# Patient Record
Sex: Male | Born: 1935 | Race: White | Hispanic: No | Smoking: Former smoker
Health system: Southern US, Community
[De-identification: ages and names within clinical notes are randomized; demographics above are authoritative.]

## PROBLEM LIST (undated history)

## (undated) DIAGNOSIS — G9340 Encephalopathy, unspecified: Secondary | ICD-10-CM

## (undated) DIAGNOSIS — I10 Essential (primary) hypertension: Secondary | ICD-10-CM

## (undated) DIAGNOSIS — F0390 Unspecified dementia without behavioral disturbance: Secondary | ICD-10-CM

---

## 2002-01-13 ENCOUNTER — Encounter: Payer: Self-pay | Admitting: Orthopedic Surgery

## 2002-01-18 ENCOUNTER — Encounter: Payer: Self-pay | Admitting: Orthopedic Surgery

## 2002-01-18 ENCOUNTER — Observation Stay (HOSPITAL_COMMUNITY): Admission: RE | Admit: 2002-01-18 | Discharge: 2002-01-19 | Payer: Self-pay | Admitting: Orthopedic Surgery

## 2004-03-06 ENCOUNTER — Ambulatory Visit (HOSPITAL_BASED_OUTPATIENT_CLINIC_OR_DEPARTMENT_OTHER): Admission: RE | Admit: 2004-03-06 | Discharge: 2004-03-06 | Payer: Self-pay | Admitting: Orthopedic Surgery

## 2005-08-13 ENCOUNTER — Ambulatory Visit: Payer: Self-pay | Admitting: Physical Medicine & Rehabilitation

## 2005-08-13 ENCOUNTER — Inpatient Hospital Stay (HOSPITAL_COMMUNITY): Admission: RE | Admit: 2005-08-13 | Discharge: 2005-08-18 | Payer: Self-pay | Admitting: Orthopedic Surgery

## 2013-12-15 ENCOUNTER — Encounter (HOSPITAL_BASED_OUTPATIENT_CLINIC_OR_DEPARTMENT_OTHER): Payer: Self-pay | Admitting: Emergency Medicine

## 2013-12-15 ENCOUNTER — Emergency Department (HOSPITAL_BASED_OUTPATIENT_CLINIC_OR_DEPARTMENT_OTHER)
Admission: EM | Admit: 2013-12-15 | Discharge: 2013-12-16 | Disposition: A | Discharge status: Home / Self Care | Payer: Medicare Other | Attending: Emergency Medicine | Admitting: Emergency Medicine

## 2013-12-15 ENCOUNTER — Emergency Department (HOSPITAL_BASED_OUTPATIENT_CLINIC_OR_DEPARTMENT_OTHER): Payer: Medicare Other

## 2013-12-15 DIAGNOSIS — Z87891 Personal history of nicotine dependence: Secondary | ICD-10-CM | POA: Insufficient documentation

## 2013-12-15 DIAGNOSIS — F17200 Nicotine dependence, unspecified, uncomplicated: Secondary | ICD-10-CM | POA: Insufficient documentation

## 2013-12-15 DIAGNOSIS — Z79899 Other long term (current) drug therapy: Secondary | ICD-10-CM | POA: Insufficient documentation

## 2013-12-15 DIAGNOSIS — S59919A Unspecified injury of unspecified forearm, initial encounter: Secondary | ICD-10-CM

## 2013-12-15 DIAGNOSIS — Z8659 Personal history of other mental and behavioral disorders: Secondary | ICD-10-CM | POA: Diagnosis not present

## 2013-12-15 DIAGNOSIS — W19XXXA Unspecified fall, initial encounter: Secondary | ICD-10-CM

## 2013-12-15 DIAGNOSIS — Z8669 Personal history of other diseases of the nervous system and sense organs: Secondary | ICD-10-CM | POA: Diagnosis not present

## 2013-12-15 DIAGNOSIS — Y9389 Activity, other specified: Secondary | ICD-10-CM | POA: Insufficient documentation

## 2013-12-15 DIAGNOSIS — I10 Essential (primary) hypertension: Secondary | ICD-10-CM | POA: Insufficient documentation

## 2013-12-15 DIAGNOSIS — S59909A Unspecified injury of unspecified elbow, initial encounter: Secondary | ICD-10-CM | POA: Insufficient documentation

## 2013-12-15 DIAGNOSIS — Y9210 Unspecified residential institution as the place of occurrence of the external cause: Secondary | ICD-10-CM | POA: Diagnosis not present

## 2013-12-15 DIAGNOSIS — R42 Dizziness and giddiness: Secondary | ICD-10-CM | POA: Insufficient documentation

## 2013-12-15 DIAGNOSIS — S51809A Unspecified open wound of unspecified forearm, initial encounter: Secondary | ICD-10-CM | POA: Insufficient documentation

## 2013-12-15 DIAGNOSIS — IMO0002 Reserved for concepts with insufficient information to code with codable children: Secondary | ICD-10-CM | POA: Diagnosis not present

## 2013-12-15 DIAGNOSIS — R296 Repeated falls: Secondary | ICD-10-CM | POA: Diagnosis not present

## 2013-12-15 DIAGNOSIS — S51811A Laceration without foreign body of right forearm, initial encounter: Secondary | ICD-10-CM

## 2013-12-15 DIAGNOSIS — S6990XA Unspecified injury of unspecified wrist, hand and finger(s), initial encounter: Secondary | ICD-10-CM

## 2013-12-15 HISTORY — DX: Encephalopathy, unspecified: G93.40

## 2013-12-15 HISTORY — DX: Essential (primary) hypertension: I10

## 2013-12-15 HISTORY — DX: Unspecified dementia, unspecified severity, without behavioral disturbance, psychotic disturbance, mood disturbance, and anxiety: F03.90

## 2013-12-15 NOTE — ED Notes (Signed)
Pt fell this pm  ? Dizziness before fall  Not witnessed,  Ambulatory on scene  Has tear to rt arm  hematome to rt side,  Generalized soreness

## 2013-12-15 NOTE — ED Provider Notes (Addendum)
CSN: [id number]     Arrival date & time 12/15/13  2258 History  This chart was scribed for Samuel SeamenJohn L Matai Carpenito, MD by Carl Bestelina Holson, ED Scribe. This patient was seen in room MH01/MH01 and the patient's care was started at 11:30 PM.      Chief Complaint  Patient presents with  . Fall   Patient is a 80 y.o. male presenting with fall. The history is provided by a relative and the patient. The history is limited by the condition of the patient. No language interpreter was used.  Fall  HPI Comments: Samuel Young is a 80 y.o. male who presents to the Emergency Department complaining of lower back pain and a wound to his right arm that he incurred after he fell this evening while getting something to eat. He states that he remembers the fall and being dizzy for a few seconds prior to the incident. Pain is mild to moderate, worse with palpation or movement. He states he had similar dizzy spells frequently but is usually able to sit down or hold onto something and time. [facility]  Past Medical History  Diagnosis Date  . Hypertension   . Encephalopathy acute   . Dementia    No past surgical history on file. No family history on file. History  Substance Use Topics  . Smoking status: Former Games developermoker  . Smokeless tobacco: Not on file  . Alcohol Use: No    Review of Systems  Skin: Positive for wound.  Neurological: Positive for dizziness.  All other systems reviewed and are negative.   Allergies  Review of patient's allergies indicates no known allergies.  Home Medications   Prior to Admission medications   Medication Sig Start Date End Date Taking? Authorizing Provider  acetaminophen (TYLENOL) 650 MG CR tablet Take 650 mg by mouth every 8 (eight) hours as needed for pain.   Yes Historical Provider, MD  alum & mag hydroxide-simeth (MAALOX PLUS) 400-400-40 MG/5ML suspension Take by mouth every 6 (six) hours as needed for indigestion.   Yes  Historical Provider, MD  diphenhydrAMINE (BENADRYL) 25 mg capsule Take 25 mg by mouth every 6 (six) hours as needed.   Yes Historical Provider, MD  DULoxetine (CYMBALTA) 60 MG capsule Take 60 mg by mouth daily.   Yes Historical Provider, MD  lacosamide (VIMPAT) 50 MG TABS tablet Take 100 mg by mouth 2 (two) times daily.   Yes Historical Provider, MD  lisinopril-hydrochlorothiazide (PRINZIDE,ZESTORETIC) 10-12.5 MG per tablet Take 1 tablet by mouth daily.   Yes Historical Provider, MD  LORazepam (ATIVAN) 1 MG tablet Take 1 mg by mouth every 8 (eight) hours.   Yes Historical Provider, MD  nicotine (NICODERM CQ - DOSED IN MG/24 HR) 7 mg/24hr patch Place 7 mg onto the skin daily.   Yes Historical Provider, MD  ondansetron (ZOFRAN) 4 MG tablet Take 4 mg by mouth every 8 (eight) hours as needed for nausea or vomiting.   Yes Historical Provider, MD  sennosides-docusate sodium (SENOKOT-S) 8.6-50 MG tablet Take 1 tablet by mouth daily.   Yes Historical Provider, MD  traZODone (DESYREL) 50 MG tablet Take 50 mg by mouth at bedtime.   Yes Historical Provider, MD   Triage Vitals: BP 125/60  Pulse 86  Temp(Src) 98 F (36.7 C) (Oral)  Resp 21  Ht 6\' 3"  (1.905 m)  Wt 230 lb (104.327 kg)  BMI 28.75 kg/m2  SpO2 99%  Physical Exam  Nursing note and  vitals reviewed. Constitutional: He appears well-developed and well-nourished.  HENT:  Head: Normocephalic and atraumatic.  Lens implants.  No scalp hematomas.   Eyes: EOM are normal. Pupils are equal, round, and reactive to light.  Neck: Normal range of motion.  Mild posterior tenderness, primarily soft tissue.  Cardiovascular: Normal rate and regular rhythm.   Pulmonary/Chest: Breath sounds normal. No respiratory distress. He has no wheezes. He has no rales. He exhibits no tenderness.  Abdominal: Soft. There is no tenderness.  Musculoskeletal:       Thoracic back: He exhibits no tenderness.       Lumbar back: He exhibits tenderness.  Lumbar tenderness  but no thoracic tenderness.  Chronic appearing edema of the lower legs.  Skin: Skin is warm and dry.  Skin tear to his right forearm.    Neurologic: Awake alert; motor function intact in all extremities and symmetric; no facial droop; normal coordination and speech  ED Course  Procedures (including critical care time)  DIAGNOSTIC STUDIES: Oxygen Saturation is 99% on room air, normal by my interpretation.     MDM    EKG Interpretation  Date/Time:  Saturday December 16 2013 00:18:11 EDT Ventricular Rate:  74 PR Interval:  178 QRS Duration: 82 QT Interval:  372 QTC Calculation: 412 R Axis:   -17 Text Interpretation:  Normal sinus rhythm Normal ECG No significant change was found Confirmed by Nilaya Bouie  MD, Jonny Ruiz ([id number]) on 12/16/2013 12:25:57 AM       Nursing notes and vitals signs, including pulse oximetry, reviewed.  Summary of this visit's results, reviewed by myself:  Imaging Studies: Dg Cervical Spine Complete  12/16/2013   CLINICAL DATA:  Fall, right neck pain.  EXAM: CERVICAL SPINE  4+ VIEWS  COMPARISON:  None.  FINDINGS: Patient is osteopenic. No destructive bony lesions straightened cervical lordosis. Vertebral bodies appear intact and aligned. Severe C5-6 disc degeneration. Multilevel severe uncovertebral hypertrophy and facet arthropathy. At least moderate right C3-4, C4-5 and left C3-4 neural foraminal narrowing. Lateral masses in alignment. Prevertebral and paraspinal soft tissue planes are nonsuspicious.  IMPRESSION: Straightened cervical lordosis without acute fracture deformity nor malalignment though, considering patient's osteopenia, if pain persists consider follow-up CT cervical spine.   Electronically Signed   By: Awilda Metro   On: 12/16/2013 00:18   Dg Lumbar Spine Complete  12/16/2013   CLINICAL DATA:  Fall, back pain.  EXAM: LUMBAR SPINE - COMPLETE 4+ VIEW  COMPARISON:  None.  FINDINGS: Lumbar vertebral bodies appear intact and aligned and maintenance of the  lumbar lordosis. Severe L1-2, L3-4 through L5-S1 disc degeneration with endplate spurring. No destructive bony lesions no pars interarticularis defects. At least moderate lower lumbar facet arthropathy. No destructive bony lesions, bone mineral density is decreased.  Severe calcific atherosclerosis of the aorta. Sacroiliac joints are symmetric. Surgical clips in the pelvis.  IMPRESSION: No acute fracture deformity or malalignment.  Lumbar spondylosis.   Electronically Signed   By: Awilda Metro   On: 12/16/2013 00:19    I personally performed the services described in this documentation, which was scribed in my presence. The recorded information has been reviewed and is accurate.   Samuel Seamen, MD 12/16/13 6045  Samuel Seamen, MD 12/16/13 4098

## 2013-12-15 NOTE — ED Notes (Signed)
un witnessed fall this pm  Pt states was dizzy and fell  Skin tear to rt arm and hamartoma to rt side  Increased soreness

## 2013-12-16 NOTE — Discharge Instructions (Signed)

## 2014-08-29 ENCOUNTER — Emergency Department (HOSPITAL_BASED_OUTPATIENT_CLINIC_OR_DEPARTMENT_OTHER): Payer: 59

## 2014-08-29 ENCOUNTER — Emergency Department (HOSPITAL_BASED_OUTPATIENT_CLINIC_OR_DEPARTMENT_OTHER)
Admission: EM | Admit: 2014-08-29 | Discharge: 2014-08-29 | Disposition: A | Discharge status: Home / Self Care | Payer: 59 | Attending: Emergency Medicine | Admitting: Emergency Medicine

## 2014-08-29 ENCOUNTER — Encounter (HOSPITAL_BASED_OUTPATIENT_CLINIC_OR_DEPARTMENT_OTHER): Payer: Self-pay | Admitting: *Deleted

## 2014-08-29 DIAGNOSIS — S00432A Contusion of left ear, initial encounter: Secondary | ICD-10-CM | POA: Insufficient documentation

## 2014-08-29 DIAGNOSIS — S20211A Contusion of right front wall of thorax, initial encounter: Secondary | ICD-10-CM | POA: Diagnosis not present

## 2014-08-29 DIAGNOSIS — I10 Essential (primary) hypertension: Secondary | ICD-10-CM | POA: Insufficient documentation

## 2014-08-29 DIAGNOSIS — W01198A Fall on same level from slipping, tripping and stumbling with subsequent striking against other object, initial encounter: Secondary | ICD-10-CM | POA: Diagnosis not present

## 2014-08-29 DIAGNOSIS — Y9289 Other specified places as the place of occurrence of the external cause: Secondary | ICD-10-CM | POA: Diagnosis not present

## 2014-08-29 DIAGNOSIS — Z79899 Other long term (current) drug therapy: Secondary | ICD-10-CM | POA: Insufficient documentation

## 2014-08-29 DIAGNOSIS — Y9389 Activity, other specified: Secondary | ICD-10-CM | POA: Diagnosis not present

## 2014-08-29 DIAGNOSIS — S0990XA Unspecified injury of head, initial encounter: Secondary | ICD-10-CM | POA: Diagnosis present

## 2014-08-29 DIAGNOSIS — Z8669 Personal history of other diseases of the nervous system and sense organs: Secondary | ICD-10-CM | POA: Diagnosis not present

## 2014-08-29 DIAGNOSIS — Y998 Other external cause status: Secondary | ICD-10-CM | POA: Insufficient documentation

## 2014-08-29 DIAGNOSIS — F0390 Unspecified dementia without behavioral disturbance: Secondary | ICD-10-CM | POA: Diagnosis not present

## 2014-08-29 NOTE — ED Notes (Signed)
Patient transported to X-ray and CT and returned

## 2014-08-29 NOTE — ED Notes (Signed)
Pt c/o fall from standing landing on tile hitting head  Floor pt from [facility]

## 2014-08-29 NOTE — Discharge Instructions (Signed)
Head Injury °You have received a head injury. It does not appear serious at this time. Headaches and vomiting are common following head injury. It should be easy to awaken from sleeping. Sometimes it is necessary for you to stay in the emergency department for a while for observation. Sometimes admission to the hospital may be needed. After injuries such as yours, most problems occur within the first 24 hours, but side effects may occur up to 7-10 days after the injury. It is important for you to carefully monitor your condition and contact your health care provider or seek immediate medical care if there is a change in your condition. °WHAT ARE THE TYPES OF HEAD INJURIES? °Head injuries can be as minor as a bump. Some head injuries can be more severe. More severe head injuries include: °· A jarring injury to the brain (concussion). °· A bruise of the brain (contusion). This mean there is bleeding in the brain that can cause swelling. °· A cracked skull (skull fracture). °· Bleeding in the brain that collects, clots, and forms a bump (hematoma). °WHAT CAUSES A HEAD INJURY? °A serious head injury is most likely to happen to someone who is in a car wreck and is not wearing a seat belt. Other causes of major head injuries include bicycle or motorcycle accidents, sports injuries, and falls. °HOW ARE HEAD INJURIES DIAGNOSED? °A complete history of the event leading to the injury and your current symptoms will be helpful in diagnosing head injuries. Many times, pictures of the brain, such as CT or MRI are needed to see the extent of the injury. Often, an overnight hospital stay is necessary for observation.  °WHEN SHOULD I SEEK IMMEDIATE MEDICAL CARE?  °You should get help right away if: °· You have confusion or drowsiness. °· You feel sick to your stomach (nauseous) or have continued, forceful vomiting. °· You have dizziness or unsteadiness that is getting worse. °· You have severe, continued headaches not relieved by  medicine. Only take over-the-counter or prescription medicines for pain, fever, or discomfort as directed by your health care provider. °· You do not have normal function of the arms or legs or are unable to walk. °· You notice changes in the black spots in the center of the colored part of your eye (pupil). °· You have a clear or bloody fluid coming from your nose or ears. °· You have a loss of vision. °During the next 24 hours after the injury, you must stay with someone who can watch you for the warning signs. This person should contact local emergency services (911 in the [place] if you have seizures, you become unconscious, or you are unable to wake up. °HOW CAN I PREVENT A HEAD INJURY IN THE FUTURE? °The most important factor for preventing major head injuries is avoiding motor vehicle accidents.  To minimize the potential for damage to your head, it is crucial to wear seat belts while riding in motor vehicles. Wearing helmets while bike riding and playing collision sports (like football) is also helpful. Also, avoiding dangerous activities around the house will further help reduce your risk of head injury.  °WHEN CAN I RETURN TO NORMAL ACTIVITIES AND ATHLETICS? °You should be reevaluated by your health care provider before returning to these activities. If you have any of the following symptoms, you should not return to activities or contact sports until 1 week after the symptoms have stopped: °· Persistent headache. °· Dizziness or vertigo. °· Poor attention and concentration. °· Confusion. °·   Memory problems.  Nausea or vomiting.  Fatigue or tire easily.  Irritability.  Intolerant of bright lights or loud noises.  Anxiety or depression.  Disturbed sleep. MAKE SURE YOU:   Understand these instructions.  Will watch your condition.  Will get help right away if you are not doing well or get worse. Document Released: 05/04/2005 Document Revised: 05/09/2013 Document Reviewed:  01/09/2013 [facility] Patient Information 2015 [employer] [place]. This information is not intended to replace advice given to you by your health care provider. Make sure you discuss any questions you have with your health care provider.   Chest Contusion A contusion is a deep bruise. Bruises happen when an injury causes bleeding under the skin. Signs of bruising include pain, puffiness (swelling), and discolored skin. The bruise may turn blue, purple, or yellow.  HOME CARE  Put ice on the injured area.  Put ice in a plastic bag.  Place a towel between the skin and the bag.  Leave the ice on for 15-20 minutes at a time, 03-04 times a day for the first 48 hours.  Only take medicine as told by your doctor.  Rest.  Take deep breaths (deep-breathing exercises) as told by your doctor.  Stop smoking if you smoke.  Do not lift objects over 5 pounds (2.3 kilograms) for 3 days or longer if told by your doctor. GET HELP RIGHT AWAY IF:   You have more bruising or puffiness.  You have pain that gets worse.  You have trouble breathing.  You are dizzy, weak, or pass out (faint).  You have blood in your pee (urine) or poop (stool).  You cough up or throw up (vomit) blood.  Your puffiness or pain is not helped with medicines. MAKE SURE YOU:   Understand these instructions.  Will watch your condition.  Will get help right away if you are not doing well or get worse. Document Released: 10/21/2007 Document Revised: 01/27/2012 Document Reviewed: 10/26/2011 [facility] Patient Information 2015 [employer], [place]. This information is not intended to replace advice given to you by your health care provider. Make sure you discuss any questions you have with your health care provider.

## 2014-08-29 NOTE — ED Provider Notes (Signed)
CSN: [id number]     Arrival date & time 08/29/14  1655 History  This chart was scribed for Toy Cookey, MD by Chestine Spore, ED Scribe. The patient was seen in room MHH2/MHH2 at 7:02 PM.     Chief Complaint  Patient presents with  . Fall      The history is provided by the patient. No language interpreter was used.     HPI Comments: Samuel Young is a 81 y.o. male with a medical hx of HTN, encephalopathy acute, and dementia, who presents to the Emergency Department complaining of fall onset last night at 12:30. Pt lives at [facility] and the pt fell from standing landing. Pt tripped while he was going to the bathroom. Pt now has a knot on the side of his head when he landed on the chair and the wall. Pt was dx with orthostatics HTN. Pt was dizzy before the fall. Pt can not remember the fall. Pt notes that no one else saw the fall. The pt got back into his bed after the incident and didn't tell the people at his living home.   He states that he is having associated symptoms of hitting his head, neck pain, left rib pain. Pt has neck pain when he turns his head to the left. Pt son reports that the pt seems groggy and lethargic. He denies LOC, HA, hip pain, n/v, and any other symptoms. Pt is not taking blood thinners but the pt is on a baby ASA. Pt uses a walker to aid with ambulating. Pt has not ate today.   PCP- Dr. Redmond School   Past Medical History  Diagnosis Date  . Hypertension   . Encephalopathy acute   . Dementia    History reviewed. No pertinent past surgical history. History reviewed. No pertinent family history. History  Substance Use Topics  . Smoking status: Former Games developer  . Smokeless tobacco: Not on file  . Alcohol Use: No    Review of Systems  Musculoskeletal: Positive for arthralgias (left rib pain) and neck pain. Negative for neck stiffness.  Neurological: Positive for dizziness. Negative for syncope.    A complete 10 system review of systems was  obtained and all systems are negative except as noted in the HPI and PMH.    Allergies  Review of patient's allergies indicates no known allergies.  Home Medications   Prior to Admission medications   Medication Sig Start Date End Date Taking? Authorizing Provider  lacosamide (VIMPAT) 200 MG TABS tablet Take 100 mg by mouth 2 (two) times daily.   Yes Historical Provider, MD  levETIRAcetam (KEPPRA) 500 MG tablet Take 500 mg by mouth 2 (two) times daily.   Yes Historical Provider, MD  memantine (NAMENDA) 5 MG tablet Take 14 mg by mouth 2 (two) times daily.   Yes Historical Provider, MD  acetaminophen (TYLENOL) 650 MG CR tablet Take 650 mg by mouth every 8 (eight) hours as needed for pain.    Historical Provider, MD  alum & mag hydroxide-simeth (MAALOX PLUS) 400-400-40 MG/5ML suspension Take by mouth every 6 (six) hours as needed for indigestion.    Historical Provider, MD  diphenhydrAMINE (BENADRYL) 25 mg capsule Take 25 mg by mouth every 6 (six) hours as needed.    Historical Provider, MD  DULoxetine (CYMBALTA) 60 MG capsule Take 60 mg by mouth daily.    Historical Provider, MD  lisinopril-hydrochlorothiazide (PRINZIDE,ZESTORETIC) 10-12.5 MG per tablet Take 1 tablet by mouth daily.    Historical  Provider, MD  LORazepam (ATIVAN) 1 MG tablet Take 1 mg by mouth every 8 (eight) hours.    Historical Provider, MD  ondansetron (ZOFRAN) 4 MG tablet Take 4 mg by mouth every 8 (eight) hours as needed for nausea or vomiting.    Historical Provider, MD  sennosides-docusate sodium (SENOKOT-S) 8.6-50 MG tablet Take 1 tablet by mouth daily.    Historical Provider, MD  traZODone (DESYREL) 50 MG tablet Take 50 mg by mouth at bedtime.    Historical Provider, MD   BP 133/83 mmHg  Pulse 86  Temp(Src) 97.7 F (36.5 C)  Resp 16  Ht 6\' 2"  (1.88 m)  Wt 223 lb (101.152 kg)  BMI 28.62 kg/m2  SpO2 100%  Physical Exam  Constitutional: He is oriented to person, place, and time. He appears well-developed and  well-nourished. No distress.  HENT:  Head: Normocephalic. Head is with contusion.  Mouth/Throat: No oropharyngeal exudate.  contusion over left ear.   Eyes: Pupils are equal, round, and reactive to light.  Neck: Normal range of motion. Neck supple.  Left SCM tenderness.   Cardiovascular: Normal rate, regular rhythm and normal heart sounds.  Exam reveals no gallop and no friction rub.   No murmur heard. Pulmonary/Chest: Effort normal and breath sounds normal. No respiratory distress. He has no wheezes. He has no rales. He exhibits tenderness.  Right anterior chest wall tenderness.  Abdominal: Soft. Bowel sounds are normal. He exhibits no distension and no mass. There is no tenderness. There is no rebound and no guarding.  Musculoskeletal: Normal range of motion. He exhibits no edema or tenderness.  Neurological: He is alert and oriented to person, place, and time. He displays no atrophy and no tremor. No cranial nerve deficit or sensory deficit. He exhibits normal muscle tone. He displays no seizure activity. Coordination and gait normal. GCS eye subscore is 4. GCS verbal subscore is 4. GCS motor subscore is 5.  Skin: Skin is warm and dry.  Psychiatric: He has a normal mood and affect.  Nursing note and vitals reviewed.   ED Course  Procedures (including critical care time) DIAGNOSTIC STUDIES: Oxygen Saturation is 100% on RA, normal by my interpretation.    COORDINATION OF CARE: 7:10 PM-Discussed treatment plan which includes CT head without constrast, CT C-Spine without contrast, CXR, and EKG, with pt at bedside and pt agreed to plan.   Labs Review Labs Reviewed - No data to display  Imaging Review Dg Chest 2 View  08/29/2014   CLINICAL DATA:  Chest pain after fall  EXAM: CHEST  2 VIEW  COMPARISON:  August 11, 2005  FINDINGS: There is bibasilar fibrotic type change. There is no edema or consolidation. Heart size and pulmonary vascularity are within normal limits. No adenopathy. There  is atherosclerotic change in the aorta. There is mild anterior wedging of a mid thoracic vertebral body age uncertain. No pneumothorax.  IMPRESSION: Bibasilar interstitial fibrosis. No edema or consolidation. Atherosclerotic calcification.   Electronically Signed   By: Bretta BangWilliam  Woodruff III M.D.   On: 08/29/2014 20:49   Ct Head Wo Contrast  08/29/2014   CLINICAL DATA:  Fall from standing landing on tile floor, hitting head. Denies loss of consciousness.  EXAM: CT HEAD WITHOUT CONTRAST  CT CERVICAL SPINE WITHOUT CONTRAST  TECHNIQUE: Multidetector CT imaging of the head and cervical spine was performed following the standard protocol without intravenous contrast. Multiplanar CT image reconstructions of the cervical spine were also generated.  COMPARISON:  None.  FINDINGS: CT  HEAD FINDINGS  No intracranial hemorrhage, mass effect, or midline shift. No hydrocephalus. The basilar cisterns are patent. No evidence of territorial infarct. No intracranial fluid collection. Generalized cerebral and cerebellar atrophy. Moderate periventricular and deep white matter change. Minimal left subgaleal hematoma. Calvarium is intact. Included paranasal sinuses and mastoid air cells are well aerated.  CT CERVICAL SPINE FINDINGS  No acute fracture. No subluxation. No jumped or perched facets. The dens is intact. There is multilevel degenerative disc disease and facet arthropathy throughout. Disc space narrowing from C3-C4 through C7-T1. Grade 1 anterolisthesis of C6 on C7 appears degenerative. Diffuse facet arthropathy throughout. There is no prevertebral soft tissue edema.  IMPRESSION: 1.  No acute intracranial abnormality. 2. Diffuse degenerative change throughout cervical spine without acute fracture or subluxation.   Electronically Signed   By: Rubye Oaks M.D.   On: 08/29/2014 20:27   Ct Cervical Spine Wo Contrast  08/29/2014   CLINICAL DATA:  Fall from standing landing on tile floor, hitting head. Denies loss of  consciousness.  EXAM: CT HEAD WITHOUT CONTRAST  CT CERVICAL SPINE WITHOUT CONTRAST  TECHNIQUE: Multidetector CT imaging of the head and cervical spine was performed following the standard protocol without intravenous contrast. Multiplanar CT image reconstructions of the cervical spine were also generated.  COMPARISON:  None.  FINDINGS: CT HEAD FINDINGS  No intracranial hemorrhage, mass effect, or midline shift. No hydrocephalus. The basilar cisterns are patent. No evidence of territorial infarct. No intracranial fluid collection. Generalized cerebral and cerebellar atrophy. Moderate periventricular and deep white matter change. Minimal left subgaleal hematoma. Calvarium is intact. Included paranasal sinuses and mastoid air cells are well aerated.  CT CERVICAL SPINE FINDINGS  No acute fracture. No subluxation. No jumped or perched facets. The dens is intact. There is multilevel degenerative disc disease and facet arthropathy throughout. Disc space narrowing from C3-C4 through C7-T1. Grade 1 anterolisthesis of C6 on C7 appears degenerative. Diffuse facet arthropathy throughout. There is no prevertebral soft tissue edema.  IMPRESSION: 1.  No acute intracranial abnormality. 2. Diffuse degenerative change throughout cervical spine without acute fracture or subluxation.   Electronically Signed   By: Rubye Oaks M.D.   On: 08/29/2014 20:27     EKG Interpretation   Date/Time:  Wednesday August 29 2014 19:29:47 EDT Ventricular Rate:  71 PR Interval:  174 QRS Duration: 88 QT Interval:  380 QTC Calculation: 412 R Axis:   -16 Text Interpretation:  Normal sinus rhythm Nonspecific T wave abnormality  Abnormal ECG No significant change since last tracing Confirmed by  DOCHERTY  MD, MEGAN [phone]) on 08/29/2014 7:34:49 PM      MDM   Final diagnoses:  Closed head injury, initial encounter  Chest wall contusion, right, initial encounter    Pt is a 81 y.o. male with Pmhx as above who presents with fall at  nursing home some time yesterday. Pt reported to staff today that he had fallen this morning and was sent of eval. Pt has no complaints currently. He has localiezed ttp of L parietal skull and R anterior chest wall. Neuro exam unremarkable, mental status at baseline. CT head, c-spine, CXR unremarkable. No acute EKG changes. Will d/c home.      Stephannie Peters evaluation in the Emergency Department is complete. It has been determined that no acute conditions requiring further emergency intervention are present at this time. The patient/guardian have been advised of the diagnosis and plan. We have discussed signs and symptoms that warrant return to the ED,  such as changes or worsening in symptoms, worsening pain, confusion, numbness, weakness, N/V.     I personally performed the services described in this documentation, which was scribed in my presence. The recorded information has been reviewed and is accurate.     Toy Cookey, MD 09/01/14 [phone]

## 2014-08-29 NOTE — ED Notes (Signed)
Patient transported to CT 

## 2014-08-30 ENCOUNTER — Emergency Department (HOSPITAL_COMMUNITY): Payer: 59

## 2014-08-30 ENCOUNTER — Emergency Department (HOSPITAL_COMMUNITY)
Admission: EM | Admit: 2014-08-30 | Discharge: 2014-08-30 | Disposition: A | Discharge status: Home / Self Care | Payer: 59 | Attending: Emergency Medicine | Admitting: Emergency Medicine

## 2014-08-30 ENCOUNTER — Encounter (HOSPITAL_COMMUNITY): Payer: Self-pay | Admitting: Emergency Medicine

## 2014-08-30 DIAGNOSIS — F0390 Unspecified dementia without behavioral disturbance: Secondary | ICD-10-CM | POA: Diagnosis not present

## 2014-08-30 DIAGNOSIS — Y9389 Activity, other specified: Secondary | ICD-10-CM | POA: Diagnosis not present

## 2014-08-30 DIAGNOSIS — I10 Essential (primary) hypertension: Secondary | ICD-10-CM | POA: Insufficient documentation

## 2014-08-30 DIAGNOSIS — Z8669 Personal history of other diseases of the nervous system and sense organs: Secondary | ICD-10-CM | POA: Diagnosis not present

## 2014-08-30 DIAGNOSIS — Z79899 Other long term (current) drug therapy: Secondary | ICD-10-CM | POA: Insufficient documentation

## 2014-08-30 DIAGNOSIS — W01198A Fall on same level from slipping, tripping and stumbling with subsequent striking against other object, initial encounter: Secondary | ICD-10-CM | POA: Diagnosis not present

## 2014-08-30 DIAGNOSIS — Y998 Other external cause status: Secondary | ICD-10-CM | POA: Diagnosis not present

## 2014-08-30 DIAGNOSIS — Y92128 Other place in nursing home as the place of occurrence of the external cause: Secondary | ICD-10-CM | POA: Insufficient documentation

## 2014-08-30 DIAGNOSIS — S0083XA Contusion of other part of head, initial encounter: Secondary | ICD-10-CM | POA: Diagnosis not present

## 2014-08-30 DIAGNOSIS — Z87891 Personal history of nicotine dependence: Secondary | ICD-10-CM | POA: Insufficient documentation

## 2014-08-30 DIAGNOSIS — S0990XA Unspecified injury of head, initial encounter: Secondary | ICD-10-CM | POA: Diagnosis present

## 2014-08-30 DIAGNOSIS — S0093XA Contusion of unspecified part of head, initial encounter: Secondary | ICD-10-CM

## 2014-08-30 DIAGNOSIS — W19XXXA Unspecified fall, initial encounter: Secondary | ICD-10-CM

## 2014-08-30 LAB — CBC WITH DIFFERENTIAL/PLATELET
BASOS PCT: 0 % (ref 0–1)
Basophils Absolute: 0 10*3/uL (ref 0.0–0.1)
Eosinophils Absolute: 0.3 10*3/uL (ref 0.0–0.7)
Eosinophils Relative: 3 % (ref 0–5)
HCT: 48.6 % (ref 39.0–52.0)
Hemoglobin: 15.7 g/dL (ref 13.0–17.0)
Lymphocytes Relative: 23 % (ref 12–46)
Lymphs Abs: 2 10*3/uL (ref 0.7–4.0)
MCH: 28.8 pg (ref 26.0–34.0)
MCHC: 32.3 g/dL (ref 30.0–36.0)
MCV: 89 fL (ref 78.0–100.0)
MONOS PCT: 8 % (ref 3–12)
Monocytes Absolute: 0.7 10*3/uL (ref 0.1–1.0)
NEUTROS PCT: 66 % (ref 43–77)
Neutro Abs: 6 10*3/uL (ref 1.7–7.7)
PLATELETS: 243 10*3/uL (ref 150–400)
RBC: 5.46 MIL/uL (ref 4.22–5.81)
RDW: 13 % (ref 11.5–15.5)
WBC: 9 10*3/uL (ref 4.0–10.5)

## 2014-08-30 LAB — URINALYSIS, ROUTINE W REFLEX MICROSCOPIC
GLUCOSE, UA: NEGATIVE mg/dL
Hgb urine dipstick: NEGATIVE
KETONES UR: NEGATIVE mg/dL
NITRITE: NEGATIVE
PROTEIN: 30 mg/dL — AB
Specific Gravity, Urine: 1.029 (ref 1.005–1.030)
Urobilinogen, UA: 1 mg/dL (ref 0.0–1.0)
pH: 6 (ref 5.0–8.0)

## 2014-08-30 LAB — COMPREHENSIVE METABOLIC PANEL
ALT: 13 U/L (ref 0–53)
AST: 23 U/L (ref 0–37)
Albumin: 4.2 g/dL (ref 3.5–5.2)
Alkaline Phosphatase: 58 U/L (ref 39–117)
Anion gap: 8 (ref 5–15)
BUN: 24 mg/dL — AB (ref 6–23)
CO2: 30 mmol/L (ref 19–32)
CREATININE: 1.32 mg/dL (ref 0.50–1.35)
Calcium: 9.8 mg/dL (ref 8.4–10.5)
Chloride: 97 mmol/L (ref 96–112)
GFR calc non Af Amer: 48 mL/min — ABNORMAL LOW (ref >90)
GFR, EST AFRICAN AMERICAN: 56 mL/min — AB (ref >90)
GLUCOSE: 99 mg/dL (ref 70–99)
Potassium: 3.7 mmol/L (ref 3.5–5.1)
Sodium: 135 mmol/L (ref 135–145)
Total Bilirubin: 1 mg/dL (ref 0.3–1.2)
Total Protein: 7.4 g/dL (ref 6.0–8.3)

## 2014-08-30 LAB — URINE MICROSCOPIC-ADD ON

## 2014-08-30 NOTE — ED Notes (Signed)
[facility] for further evaluation after his fall today.  Pt reported that he was squatting to get something out of his dresser when he lost his balance and fell, hitting his head into the dresser and sustained hematoma to scalp on top of head.  Pt presents to ED A/Ox4, denies headache or dizziness, no s/s distress noted.

## 2014-08-30 NOTE — ED Provider Notes (Signed)
CSN: [id number]     Arrival date & time 08/30/14  1837 History   First MD Initiated Contact with Patient 08/30/14 1951     Chief Complaint  Patient presents with  . Fall  . Head Injury    Patient is a 81 y.o. male presenting with fall and head injury. The history is provided by the patient and a relative. No language interpreter was used.  Fall  Head Injury  [name] presents for evaluation of injuries from the fall. He was squatting at home and he went to get up and fell backwards striking the top of his head. This happened prior to ED arrival. He denies any loss of consciousness or headache. He had a fall yesterday and was evaluated for injuries at that time. Family is concerned because he's had 2 falls in the last 2 days. He denies any current complaints and wishes to go home. He takes Keppra and Vimpat for seizures. He does have a history of dementia. He is on a daily aspirin.   Past Medical History  Diagnosis Date  . Hypertension   . Encephalopathy acute   . Dementia    History reviewed. No pertinent past surgical history. History reviewed. No pertinent family history. History  Substance Use Topics  . Smoking status: Former Games developermoker  . Smokeless tobacco: Not on file  . Alcohol Use: No    Review of Systems  All other systems reviewed and are negative.     Allergies  Review of patient's allergies indicates no known allergies.  Home Medications   Prior to Admission medications   Medication Sig Start Date End Date Taking? Authorizing Provider  acetaminophen (TYLENOL) 650 MG CR tablet Take 650 mg by mouth every 8 (eight) hours as needed for pain.    Historical Provider, MD  alum & mag hydroxide-simeth (MAALOX PLUS) 400-400-40 MG/5ML suspension Take by mouth every 6 (six) hours as needed for indigestion.    Historical Provider, MD  diphenhydrAMINE (BENADRYL) 25 mg capsule Take 25 mg by mouth every 6 (six) hours as needed.    Historical Provider, MD  DULoxetine (CYMBALTA)  60 MG capsule Take 60 mg by mouth daily.    Historical Provider, MD  lacosamide (VIMPAT) 200 MG TABS tablet Take 100 mg by mouth 2 (two) times daily.    Historical Provider, MD  levETIRAcetam (KEPPRA) 500 MG tablet Take 500 mg by mouth 2 (two) times daily.    Historical Provider, MD  lisinopril-hydrochlorothiazide (PRINZIDE,ZESTORETIC) 10-12.5 MG per tablet Take 1 tablet by mouth daily.    Historical Provider, MD  LORazepam (ATIVAN) 1 MG tablet Take 1 mg by mouth every 8 (eight) hours.    Historical Provider, MD  memantine (NAMENDA) 5 MG tablet Take 14 mg by mouth 2 (two) times daily.    Historical Provider, MD  ondansetron (ZOFRAN) 4 MG tablet Take 4 mg by mouth every 8 (eight) hours as needed for nausea or vomiting.    Historical Provider, MD  sennosides-docusate sodium (SENOKOT-S) 8.6-50 MG tablet Take 1 tablet by mouth daily.    Historical Provider, MD  traZODone (DESYREL) 50 MG tablet Take 50 mg by mouth at bedtime.    Historical Provider, MD   BP 137/81 mmHg  Pulse 98  Temp(Src) 98.6 F (37 C) (Oral)  Resp 18  SpO2 100% Physical Exam  Constitutional: He appears well-developed and well-nourished.  HENT:  Head: Normocephalic.  Swelling and tenderness over the top of the scalp  Cardiovascular: Normal rate and regular rhythm.  No murmur heard. Pulmonary/Chest: Effort normal and breath sounds normal. No respiratory distress.  Abdominal: Soft. There is no tenderness. There is no rebound and no guarding.  Musculoskeletal: He exhibits no edema or tenderness.  Neurological: He is alert.  Oriented to place.  MAE symmetrically.    Skin: Skin is warm and dry.  Psychiatric: He has a normal mood and affect. His behavior is normal.  Nursing note and vitals reviewed.   ED Course  Procedures (including critical care time) Labs Review Labs Reviewed  LEVETIRACETAM LEVEL  COMPREHENSIVE METABOLIC PANEL  CBC WITH DIFFERENTIAL/PLATELET  URINALYSIS, ROUTINE W REFLEX MICROSCOPIC    Imaging  Review Dg Chest 2 View  08/29/2014   CLINICAL DATA:  Chest pain after fall  EXAM: CHEST  2 VIEW  COMPARISON:  August 11, 2005  FINDINGS: There is bibasilar fibrotic type change. There is no edema or consolidation. Heart size and pulmonary vascularity are within normal limits. No adenopathy. There is atherosclerotic change in the aorta. There is mild anterior wedging of a mid thoracic vertebral body age uncertain. No pneumothorax.  IMPRESSION: Bibasilar interstitial fibrosis. No edema or consolidation. Atherosclerotic calcification.   Electronically Signed   By: Bretta Bang III M.D.   On: 08/29/2014 20:49   Ct Head Wo Contrast  08/30/2014   CLINICAL DATA:  Fall, head injury, scalp hematoma  EXAM: CT HEAD WITHOUT CONTRAST  TECHNIQUE: Contiguous axial images were obtained from the base of the skull through the vertex without intravenous contrast.  COMPARISON:  08/29/2014  FINDINGS: No evidence of parenchymal hemorrhage or extra-axial fluid collection. No mass lesion, mass effect, or midline shift.  No CT evidence of acute infarction.  Subcortical white matter and periventricular small vessel ischemic changes.  Global cortical atrophy.  No ventriculomegaly.  The visualized paranasal sinuses are essentially clear. The mastoid air cells are unopacified.  No evidence of calvarial fracture.  IMPRESSION: No evidence of acute intracranial abnormality.  Atrophy with small vessel ischemic changes.   Electronically Signed   By: Charline Bills M.D.   On: 08/30/2014 21:45   Ct Head Wo Contrast  08/29/2014   CLINICAL DATA:  Fall from standing landing on tile floor, hitting head. Denies loss of consciousness.  EXAM: CT HEAD WITHOUT CONTRAST  CT CERVICAL SPINE WITHOUT CONTRAST  TECHNIQUE: Multidetector CT imaging of the head and cervical spine was performed following the standard protocol without intravenous contrast. Multiplanar CT image reconstructions of the cervical spine were also generated.  COMPARISON:  None.   FINDINGS: CT HEAD FINDINGS  No intracranial hemorrhage, mass effect, or midline shift. No hydrocephalus. The basilar cisterns are patent. No evidence of territorial infarct. No intracranial fluid collection. Generalized cerebral and cerebellar atrophy. Moderate periventricular and deep white matter change. Minimal left subgaleal hematoma. Calvarium is intact. Included paranasal sinuses and mastoid air cells are well aerated.  CT CERVICAL SPINE FINDINGS  No acute fracture. No subluxation. No jumped or perched facets. The dens is intact. There is multilevel degenerative disc disease and facet arthropathy throughout. Disc space narrowing from C3-C4 through C7-T1. Grade 1 anterolisthesis of C6 on C7 appears degenerative. Diffuse facet arthropathy throughout. There is no prevertebral soft tissue edema.  IMPRESSION: 1.  No acute intracranial abnormality. 2. Diffuse degenerative change throughout cervical spine without acute fracture or subluxation.   Electronically Signed   By: Rubye Oaks M.D.   On: 08/29/2014 20:27   Ct Cervical Spine Wo Contrast  08/29/2014   CLINICAL DATA:  Fall from standing landing on  tile floor, hitting head. Denies loss of consciousness.  EXAM: CT HEAD WITHOUT CONTRAST  CT CERVICAL SPINE WITHOUT CONTRAST  TECHNIQUE: Multidetector CT imaging of the head and cervical spine was performed following the standard protocol without intravenous contrast. Multiplanar CT image reconstructions of the cervical spine were also generated.  COMPARISON:  None.  FINDINGS: CT HEAD FINDINGS  No intracranial hemorrhage, mass effect, or midline shift. No hydrocephalus. The basilar cisterns are patent. No evidence of territorial infarct. No intracranial fluid collection. Generalized cerebral and cerebellar atrophy. Moderate periventricular and deep white matter change. Minimal left subgaleal hematoma. Calvarium is intact. Included paranasal sinuses and mastoid air cells are well aerated.  CT CERVICAL SPINE  FINDINGS  No acute fracture. No subluxation. No jumped or perched facets. The dens is intact. There is multilevel degenerative disc disease and facet arthropathy throughout. Disc space narrowing from C3-C4 through C7-T1. Grade 1 anterolisthesis of C6 on C7 appears degenerative. Diffuse facet arthropathy throughout. There is no prevertebral soft tissue edema.  IMPRESSION: 1.  No acute intracranial abnormality. 2. Diffuse degenerative change throughout cervical spine without acute fracture or subluxation.   Electronically Signed   By: Rubye Oaks M.D.   On: 08/29/2014 20:27     EKG Interpretation None      MDM   Final diagnoses:  Fall, initial encounter  Head contusion, initial encounter    Patient with history of falls here for evaluation of injuries following falls. Patient does have some swelling to his scalp with minimal tenderness. No evidence of acute intracranial abnormality on CT head. Screening lab work sent given patient's increased number of falls recently(2). Family requests Keppra level at request of nursing home. Keppra level has been ordered but will not be reported back in the emergency department. Discussed with family importance of neurology or PCP follow-up for follow-up of the Keppra level.    Tilden Fossa, MD 08/30/14 [phone]

## 2014-08-30 NOTE — ED Notes (Signed)
Bed: [facility] Expected date:  Expected time:  Means of arrival:  Comments: EMS fall 

## 2014-08-30 NOTE — Progress Notes (Addendum)
[facility]  [facility]rip.  Patient's family at bedside confirm patient's pcp at nursing facility is Dr. Sherri Radrip.  Patient confirms he uses a walker when he is ambulating.  [facility] patient to always use his walker when ambulating.  [facility] asked patient if he is receiving physical therapy at nursing facility?  Patient's family report patient was evaluated last week for PT and wiil be starting PT this week.  Patient's family reports sometimes the patient refuses therapy.  [facility] strongly encouraged patient to participate in PT due to frequent falls recently.  Patient and family thankful for assistance.  No further EDCM needs at this time.

## 2014-08-30 NOTE — Discharge Instructions (Signed)
Please follow up with your Neurologist to find out the results of your keppra level.     Facial or Scalp Contusion A facial or scalp contusion is a deep bruise on the face or head. Injuries to the face and head generally cause a lot of swelling, especially around the eyes. Contusions are the result of an injury that caused bleeding under the skin. The contusion may turn blue, purple, or yellow. Minor injuries will give you a painless contusion, but more severe contusions may stay painful and swollen for a few weeks.  CAUSES  A facial or scalp contusion is caused by a blunt injury or trauma to the face or head area.  SIGNS AND SYMPTOMS   Swelling of the injured area.   Discoloration of the injured area.   Tenderness, soreness, or pain in the injured area.  DIAGNOSIS  The diagnosis can be made by taking a medical history and doing a physical exam. An X-ray exam, CT scan, or MRI may be needed to determine if there are any associated injuries, such as broken bones (fractures). TREATMENT  Often, the best treatment for a facial or scalp contusion is applying cold compresses to the injured area. Over-the-counter medicines may also be recommended for pain control.  HOME CARE INSTRUCTIONS   Only take over-the-counter or prescription medicines as directed by your health care provider.   Apply ice to the injured area.   Put ice in a plastic bag.   Place a towel between your skin and the bag.   Leave the ice on for 20 minutes, 2-3 times a day.  SEEK MEDICAL CARE IF:  You have bite problems.   You have pain with chewing.   You are concerned about facial defects. SEEK IMMEDIATE MEDICAL CARE IF:  You have severe pain or a headache that is not relieved by medicine.   You have unusual sleepiness, confusion, or personality changes.   You throw up (vomit).   You have a persistent nosebleed.   You have double vision or blurred vision.   You have fluid drainage from your  nose or ear.   You have difficulty walking or using your arms or legs.  MAKE SURE YOU:   Understand these instructions.  Will watch your condition.  Will get help right away if you are not doing well or get worse. Document Released: 06/11/2004 Document Revised: 02/22/2013 Document Reviewed: 12/15/2012 [facility] Patient Information 2015 [employer] [place]. This information is not intended to replace advice given to you by your health care provider. Make sure you discuss any questions you have with your health care provider.  Fall Prevention and Home Safety Falls cause injuries and can affect all age groups. It is possible to use preventive measures to significantly decrease the likelihood of falls. There are many simple measures which can make your home safer and prevent falls. OUTDOORS  Repair cracks and edges of walkways and driveways.  Remove high doorway thresholds.  Trim shrubbery on the main path into your home.  Have good outside lighting.  Clear walkways of tools, rocks, debris, and clutter.  Check that handrails are not broken and are securely fastened. Both sides of steps should have handrails.  Have leaves, snow, and ice cleared regularly.  Use sand or salt on walkways during winter months.  In the garage, clean up grease or oil spills. BATHROOM  Install night lights.  Install grab bars by the toilet and in the tub and shower.  Use non-skid mats or decals in  the tub or shower.  Place a plastic non-slip stool in the shower to sit on, if needed.  Keep floors dry and clean up all water on the floor immediately.  Remove soap buildup in the tub or shower on a regular basis.  Secure bath mats with non-slip, double-sided rug tape.  Remove throw rugs and tripping hazards from the floors. BEDROOMS  Install night lights.  Make sure a bedside light is easy to reach.  Do not use oversized bedding.  Keep a telephone by your bedside.  Have a firm chair with side  arms to use for getting dressed.  Remove throw rugs and tripping hazards from the floor. KITCHEN  Keep handles on pots and pans turned toward the center of the stove. Use back burners when possible.  Clean up spills quickly and allow time for drying.  Avoid walking on wet floors.  Avoid hot utensils and knives.  Position shelves so they are not too high or low.  Place commonly used objects within easy reach.  If necessary, use a sturdy step stool with a grab bar when reaching.  Keep electrical cables out of the way.  Do not use floor polish or wax that makes floors slippery. If you must use wax, use non-skid floor wax.  Remove throw rugs and tripping hazards from the floor. STAIRWAYS  Never leave objects on stairs.  Place handrails on both sides of stairways and use them. Fix any loose handrails. Make sure handrails on both sides of the stairways are as long as the stairs.  Check carpeting to make sure it is firmly attached along stairs. Make repairs to worn or loose carpet promptly.  Avoid placing throw rugs at the top or bottom of stairways, or properly secure the rug with carpet tape to prevent slippage. Get rid of throw rugs, if possible.  Have an electrician put in a light switch at the top and bottom of the stairs. OTHER FALL PREVENTION TIPS  Wear low-heel or rubber-soled shoes that are supportive and fit well. Wear closed toe shoes.  When using a stepladder, make sure it is fully opened and both spreaders are firmly locked. Do not climb a closed stepladder.  Add color or contrast paint or tape to grab bars and handrails in your home. Place contrasting color strips on first and last steps.  Learn and use mobility aids as needed. Install an electrical emergency response system.  Turn on lights to avoid dark areas. Replace light bulbs that burn out immediately. Get light switches that glow.  Arrange furniture to create clear pathways. Keep furniture in the same  place.  Firmly attach carpet with non-skid or double-sided tape.  Eliminate uneven floor surfaces.  Select a carpet pattern that does not visually hide the edge of steps.  Be aware of all pets. OTHER HOME SAFETY TIPS  Set the water temperature for 120 F (48.8 C).  Keep emergency numbers on or near the telephone.  Keep smoke detectors on every level of the home and near sleeping areas. Document Released: 04/24/2002 Document Revised: 11/03/2011 Document Reviewed: 07/24/2011 [facility] Patient Information 2015 [place], [place]. This information is not intended to replace advice given to you by your health care provider. Make sure you discuss any questions you have with your health care provider.

## 2014-09-04 LAB — LEVETIRACETAM LEVEL: Levetiracetam Lvl: 22.1 ug/mL (ref 10.0–40.0)

## 2016-08-21 IMAGING — CT CT CERVICAL SPINE W/O CM
4 of 6 series · 12 of 33 positions shown, 14 images · non-contrast
Comparison: None.

CLINICAL DATA: Fall from standing landing on tile floor, hitting
head. Denies loss of consciousness.

EXAM:
CT HEAD WITHOUT CONTRAST
CT CERVICAL SPINE WITHOUT CONTRAST
TECHNIQUE: Multidetector CT imaging of the head and cervical spine was
performed following the standard protocol without intravenous
contrast. Multiplanar CT image reconstructions of the cervical spine
were also generated.

[Series 5: c_spine 2.0 b41s st · axial · 0.24mm/px · z∈[-240,-188]mm · 2 of 80 slices shown, 3 images]
[im 27/80  soft-tissue]
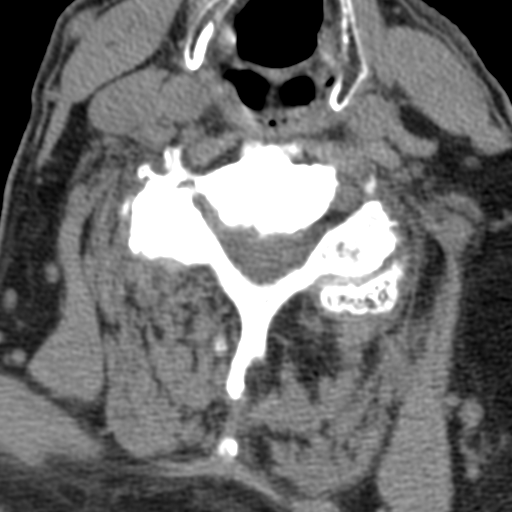
[im 27/80  bone]
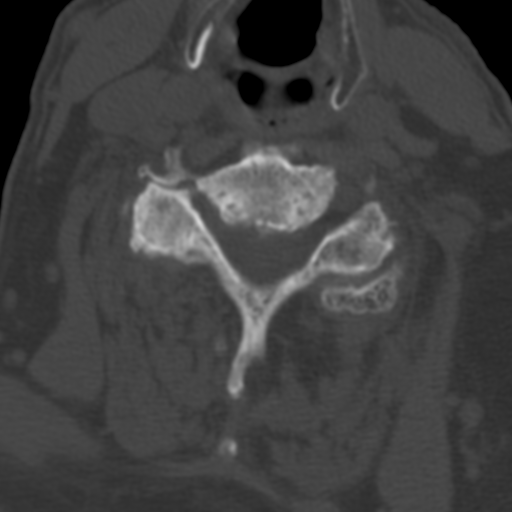
[im 53/80  bone]
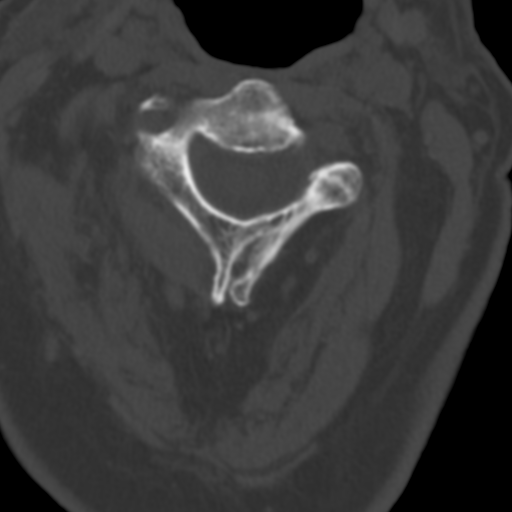

[Series 8: c_spine 2.0 coronal · coronal · 0.21mm/px · 3 of 33 slices shown]
[im 7/33  bone]
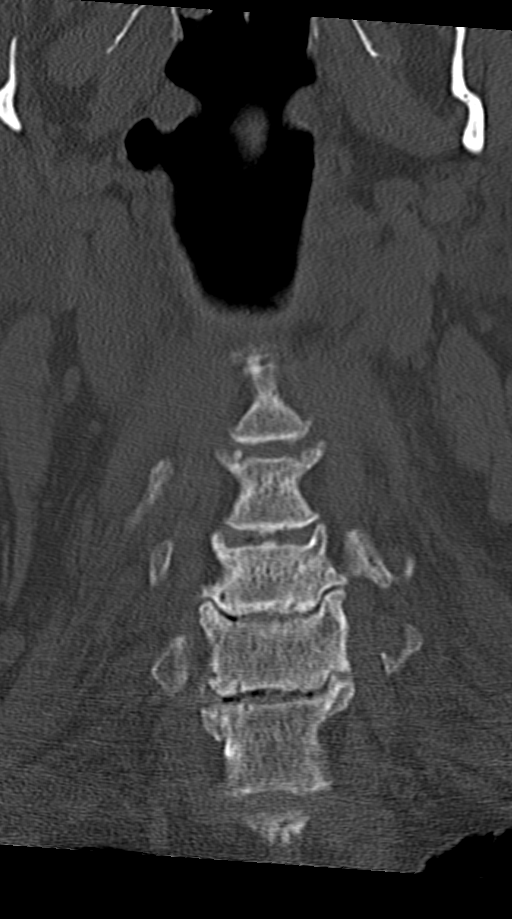
[im 13/33  bone]
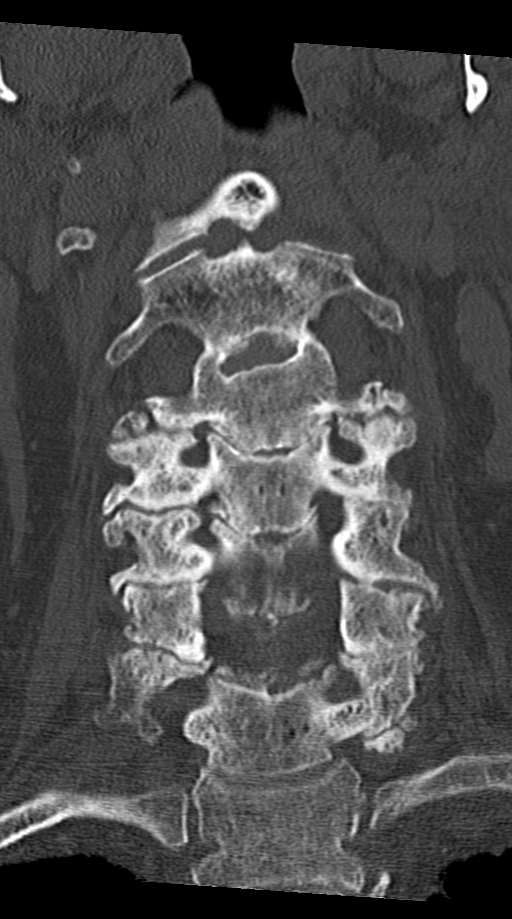
[im 20/33  bone]
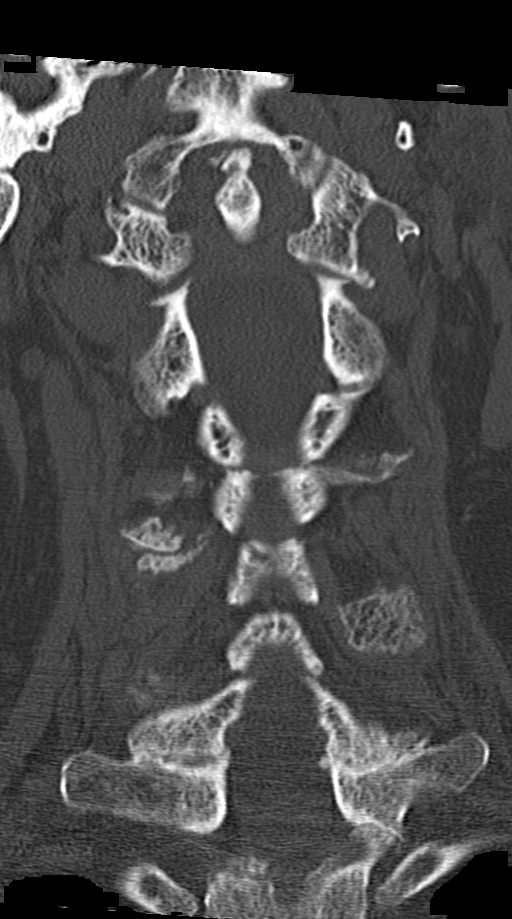

[Series 9: c_spine 2.0 sagittal · sagittal · 0.23mm/px · 5 of 36 slices shown, 6 images]
[im 12/36  bone]
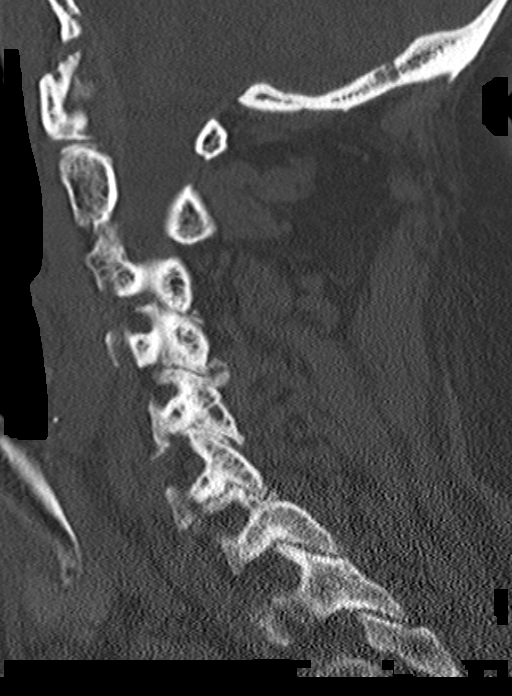
[im 15/36  bone]
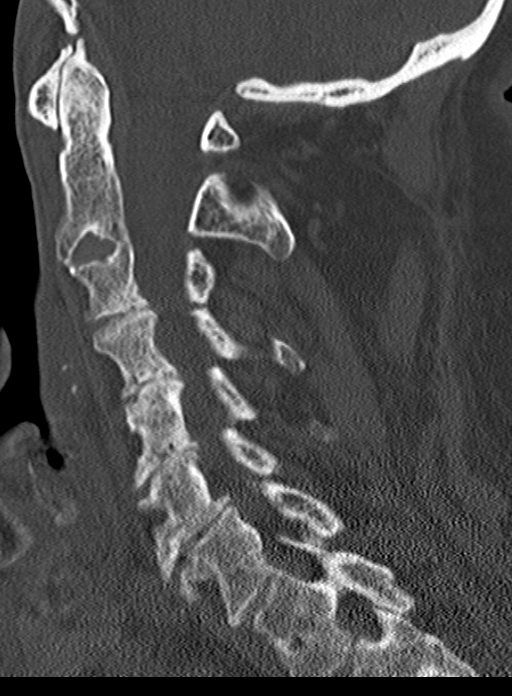
[im 18/36  soft-tissue]
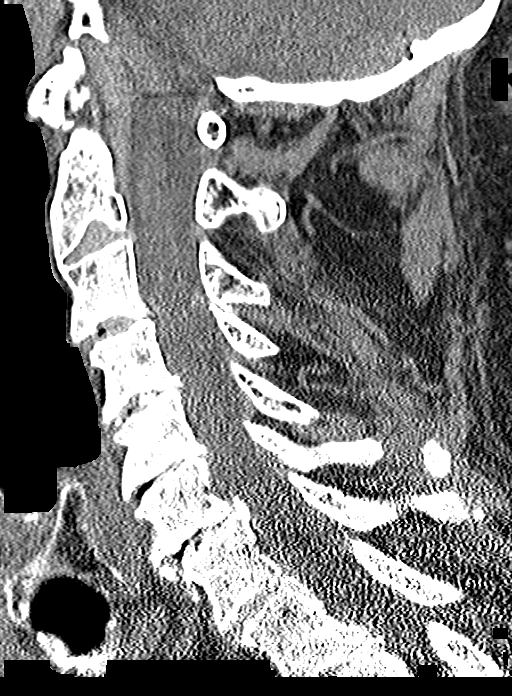
[im 18/36  bone]
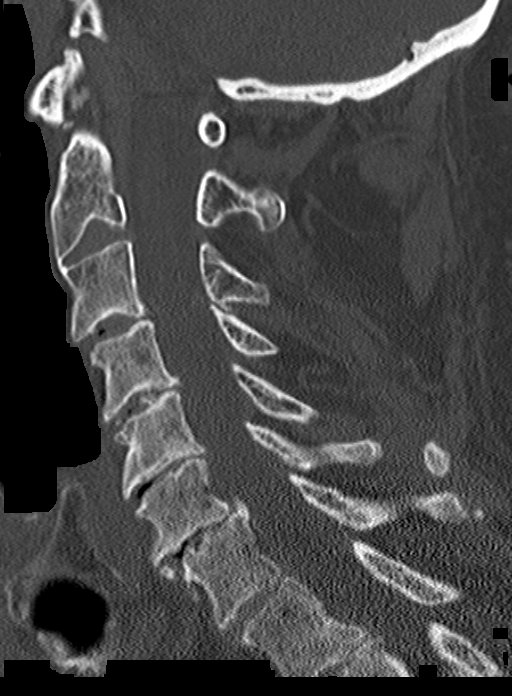
[im 21/36  bone]
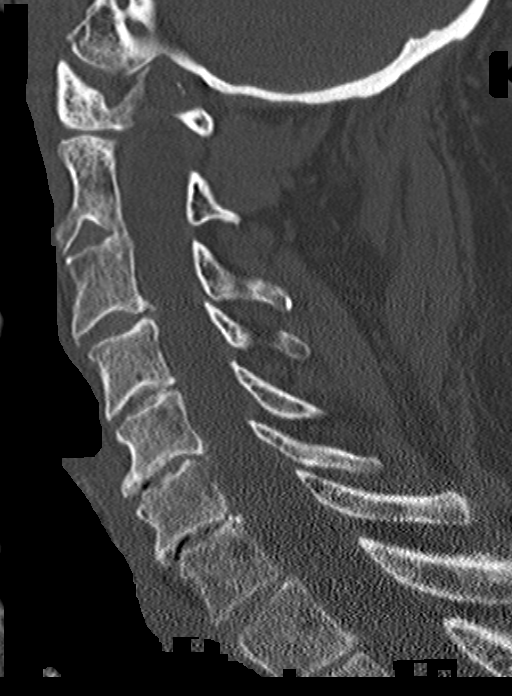
[im 24/36  bone]
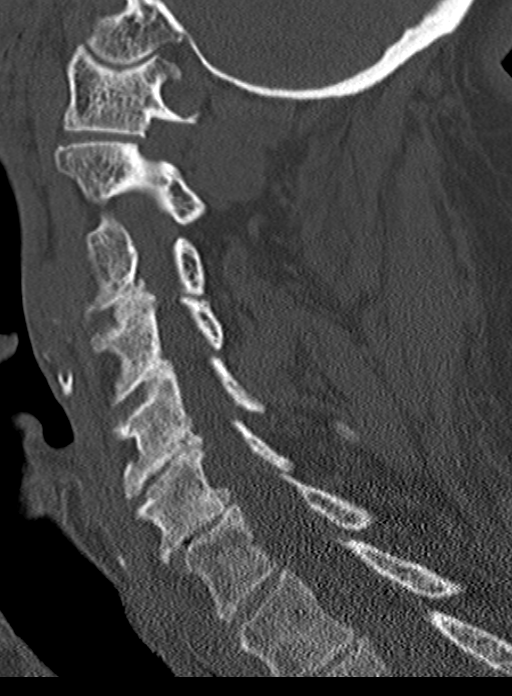

[Series 10: c_spine 2.0 orth ax · axial · 0.18mm/px · z∈[-269,-223]mm · 2 of 78 slices shown]
[im 26/78  bone]
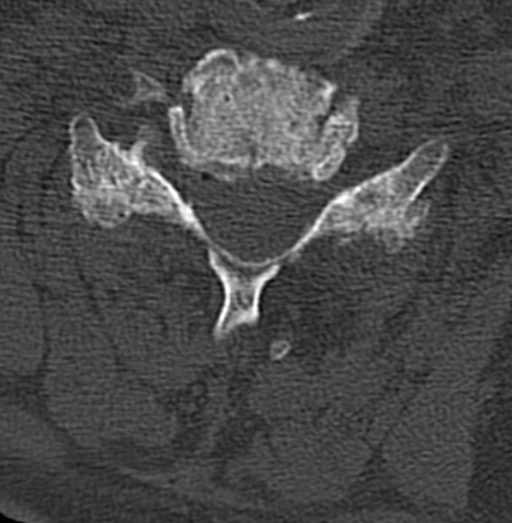
[im 52/78  bone]
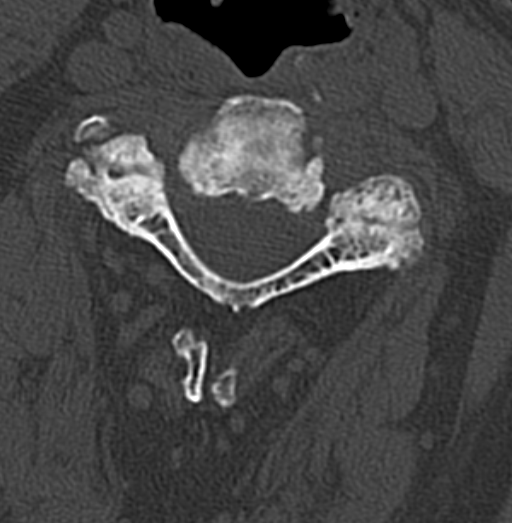

[12 of 33 positions shown; findings below may reference images not displayed]

FINDINGS: CT HEAD FINDINGS

No intracranial hemorrhage, mass effect, or midline shift. No
hydrocephalus. The basilar cisterns are patent. No evidence of
territorial infarct. No intracranial fluid collection. Generalized
cerebral and cerebellar atrophy. Moderate periventricular and deep
white matter change. Minimal left subgaleal hematoma. Calvarium is
intact. Included paranasal sinuses and mastoid air cells are well
aerated.

CT CERVICAL SPINE FINDINGS

No acute fracture. No subluxation. No jumped or perched facets. The
dens is intact. There is multilevel degenerative disc disease and
facet arthropathy throughout. Disc space narrowing from C3-C4
through C7-T1. Grade 1 anterolisthesis of C6 on C7 appears
degenerative. Diffuse facet arthropathy throughout. There is no
prevertebral soft tissue edema.
IMPRESSION: 1.  No acute intracranial abnormality.
2. Diffuse degenerative change throughout cervical spine without
acute fracture or subluxation.

## 2016-08-22 IMAGING — CT CT HEAD W/O CM
2 series · 17 of 30 positions shown, 20 images · non-contrast
Comparison: 08/29/2014

CLINICAL DATA: Fall, head injury, scalp hematoma

EXAM:
CT HEAD WITHOUT CONTRAST
TECHNIQUE: Contiguous axial images were obtained from the base of the skull
through the vertex without intravenous contrast.

[Series 2: head w/o · axial · non-contrast · 0.43mm/px · z∈[+1286,+1416]mm · 9 of 34 slices shown, 12 images]
[im 4/34  brain]
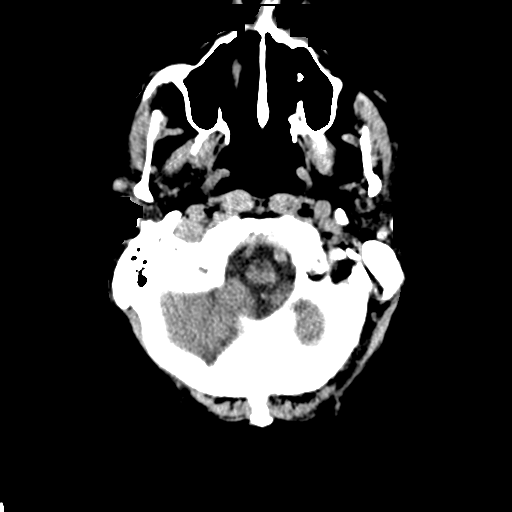
[im 4/34  bone]
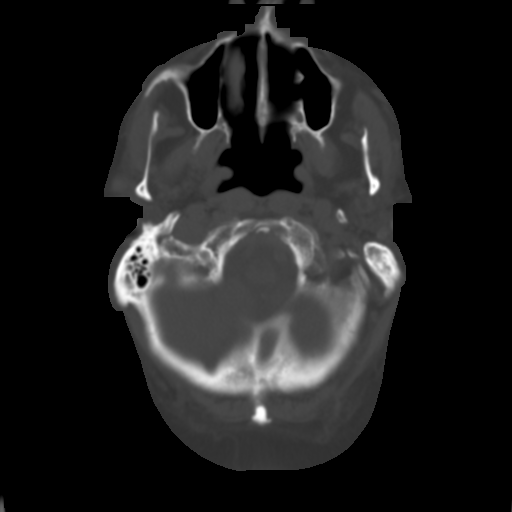
[im 7/34  brain]
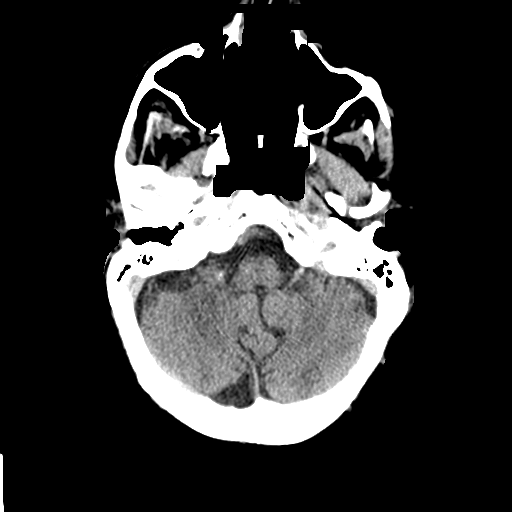
[im 10/34  brain]
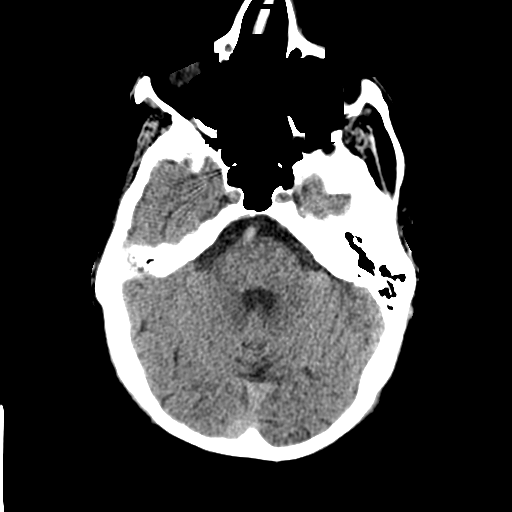
[im 14/34  brain]
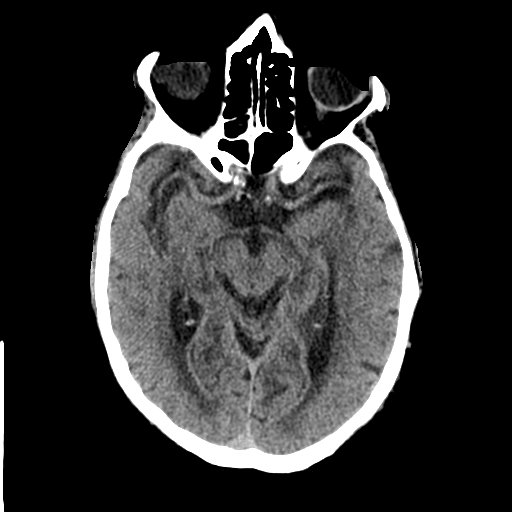
[im 17/34  brain]
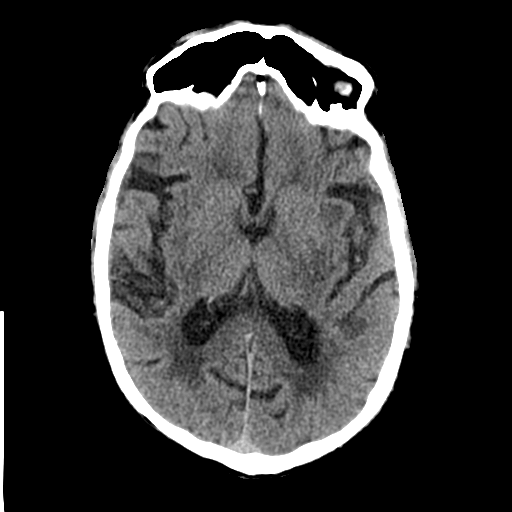
[im 17/34  bone]
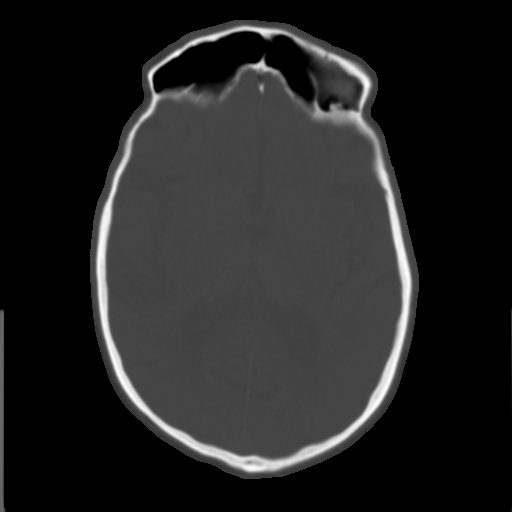
[im 20/34  brain]
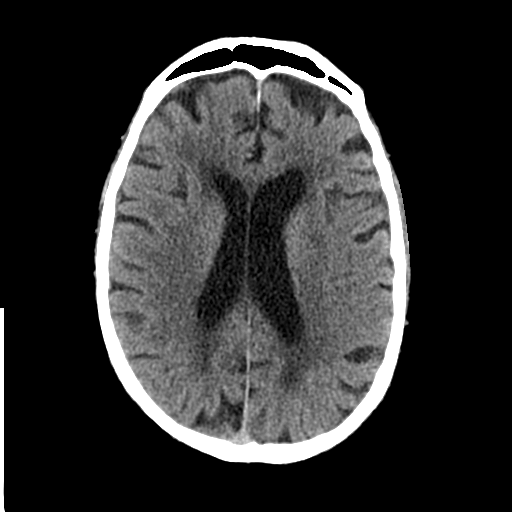
[im 24/34  brain]
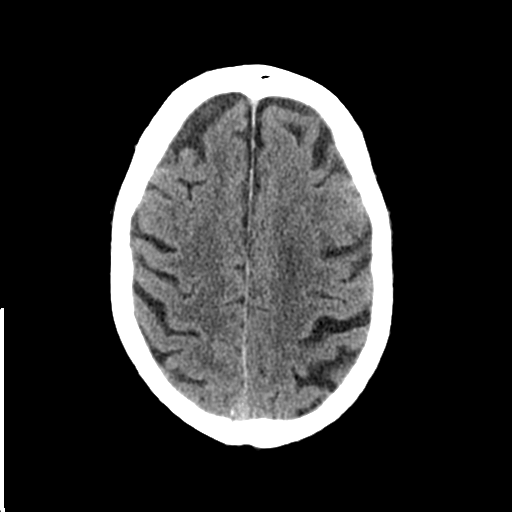
[im 27/34  brain]
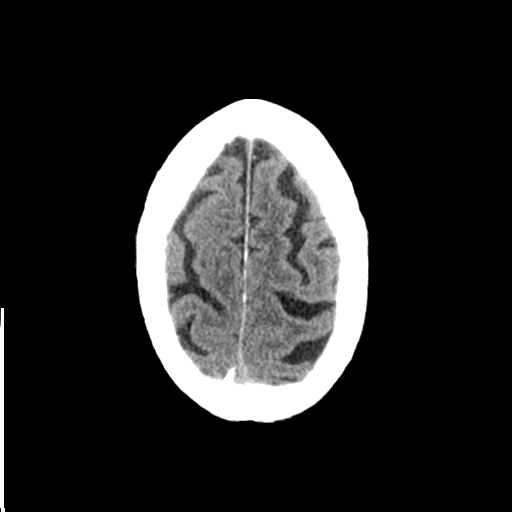
[im 30/34  brain]
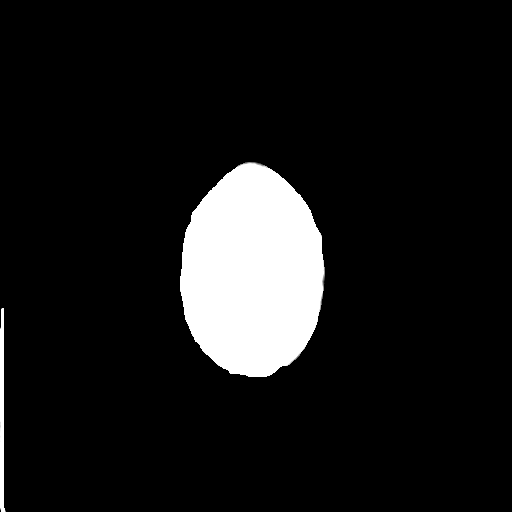
[im 30/34  bone]
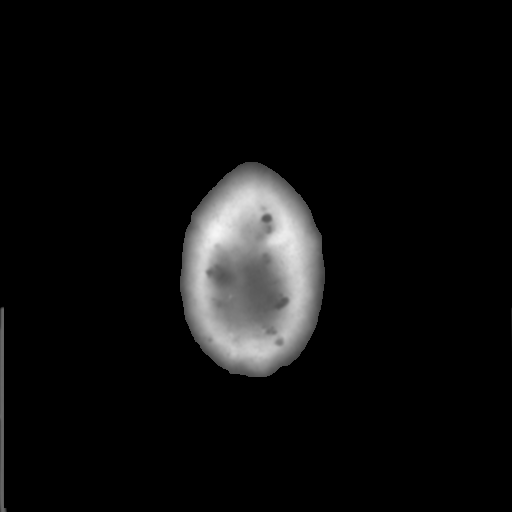

[Series 3: bone windows · axial · 0.43mm/px · z∈[+1289,+1415]mm · 8 of 56 slices shown]
[im 7/56  bone]
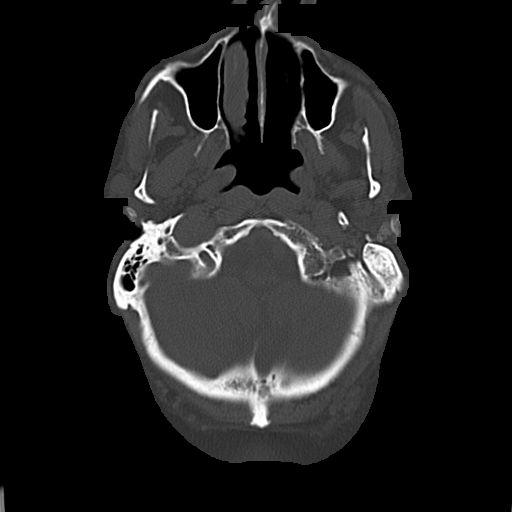
[im 13/56  bone]
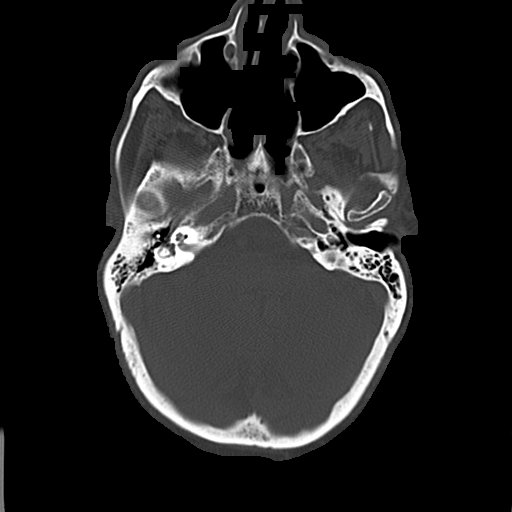
[im 19/56  bone]
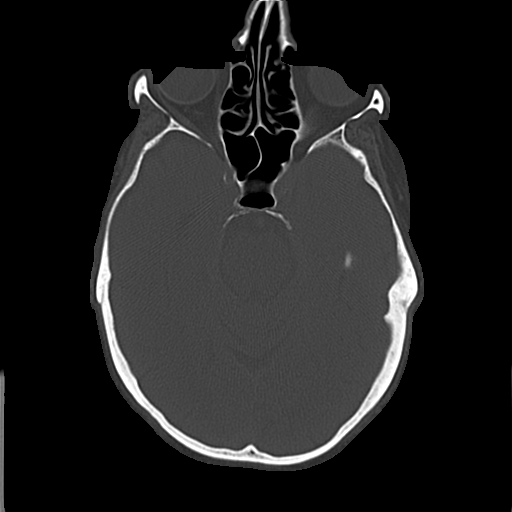
[im 25/56  bone]
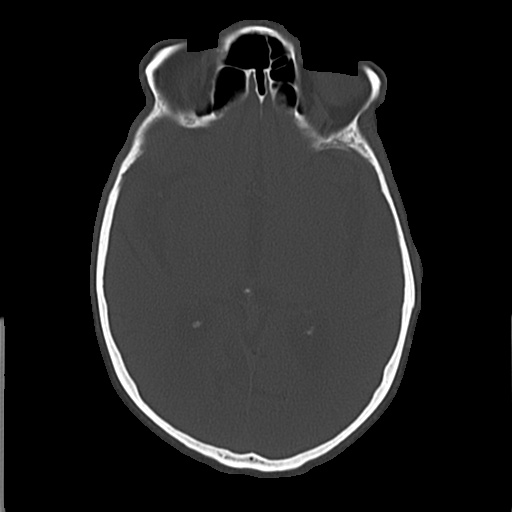
[im 31/56  bone]
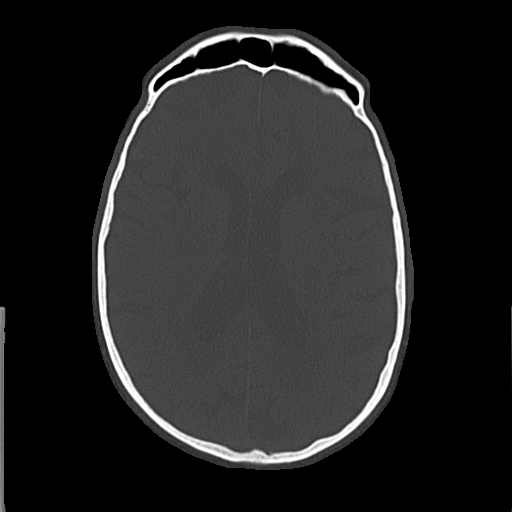
[im 37/56  bone]
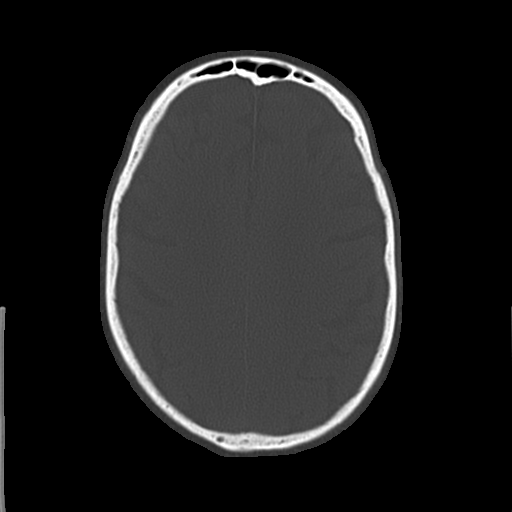
[im 43/56  bone]
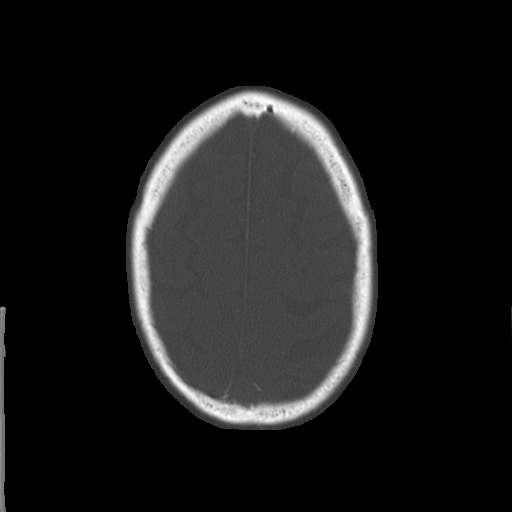
[im 49/56  bone]
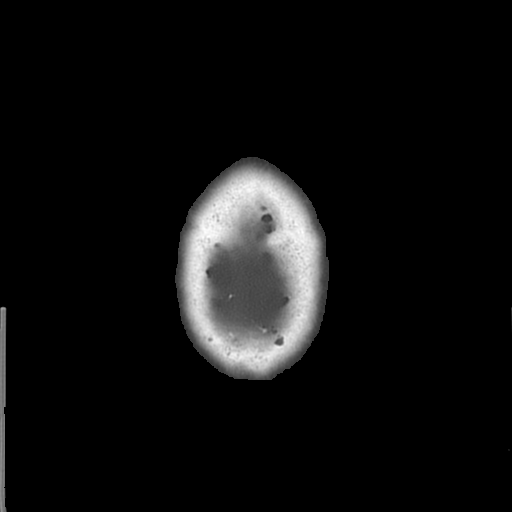

[17 of 30 positions shown; findings below may reference images not displayed]

FINDINGS: No evidence of parenchymal hemorrhage or extra-axial fluid
collection. No mass lesion, mass effect, or midline shift.

No CT evidence of acute infarction.

Subcortical white matter and periventricular small vessel ischemic
changes.

Global cortical atrophy.  No ventriculomegaly.

The visualized paranasal sinuses are essentially clear. The mastoid
air cells are unopacified.

No evidence of calvarial fracture.
IMPRESSION: No evidence of acute intracranial abnormality.

Atrophy with small vessel ischemic changes.
